# Patient Record
Sex: Female | Born: 1960 | Race: White | Hispanic: No | Marital: Single | Smoking: Former smoker
Health system: Southern US, Community
[De-identification: ages and names within clinical notes are randomized; demographics above are authoritative.]

## PROBLEM LIST (undated history)

## (undated) DIAGNOSIS — J45909 Unspecified asthma, uncomplicated: Secondary | ICD-10-CM

## (undated) DIAGNOSIS — K219 Gastro-esophageal reflux disease without esophagitis: Secondary | ICD-10-CM

## (undated) HISTORY — PX: ABDOMINAL HYSTERECTOMY: SHX81

## (undated) HISTORY — DX: Gastro-esophageal reflux disease without esophagitis: K21.9

## (undated) HISTORY — DX: Unspecified asthma, uncomplicated: J45.909

## (undated) HISTORY — PX: BREAST CYST ASPIRATION: SHX578

---

## 2015-03-27 LAB — IFOBT (OCCULT BLOOD): IFOBT: NEGATIVE

## 2017-03-25 DIAGNOSIS — Z139 Encounter for screening, unspecified: Secondary | ICD-10-CM

## 2017-03-25 LAB — POCT URINALYSIS DIPSTICK
Bilirubin, UA: NEGATIVE
GLUCOSE UA: NEGATIVE
KETONES UA: NEGATIVE
Leukocytes, UA: NEGATIVE
Nitrite, UA: NEGATIVE
Protein, UA: NEGATIVE
UROBILINOGEN UA: 0.2 U/dL
pH, UA: 7 (ref 5.0–8.0)

## 2017-03-25 LAB — GLUCOSE, POCT (MANUAL RESULT ENTRY): POC Glucose: 111 mg/dl — AB (ref 70–99)

## 2017-03-25 NOTE — Congregational Nurse Program (Signed)
Congregational Nurse Program Note  Date of Encounter: 03/25/2017  Past Medical History: No past medical history on file.  Encounter Details:     CNP Questionnaire - 03/25/17 1312      Patient Demographics   Is this a new or existing patient? New   Patient is considered a/an Not Applicable   Race Caucasian/White     Patient Assistance   Location of Patient Assistance [facility]   Patient's financial/insurance status Low Income;Self-Pay (Uninsured)   Uninsured Patient ([place] Card/Care Connects) Yes   Interventions Assisted patient in making appt.;Counseled to make appt. with provider   Patient referred to apply for the following financial assistance Medicaid   Transportation assistance No   Assistance securing medications No   Educational Designer, television/film set health;Hypertension;Navigating the healthcare system;Chronic disease;Safety     Encounter Details   Primary purpose of visit Chronic Illness/Condition Visit;Education/Health Concerns;Navigating the Healthcare System   Was an Emergency Department visit averted? Not Applicable   Does patient have a medical provider? No   Patient referred to Area Agency;Clinic;Establish PCP   Was a mental health screening completed? (GAINS tool) No   Does patient have dental issues? No   Does patient have vision issues? No   Does your patient have an abnormal blood pressure today? No   Since previous encounter, have you referred patient for abnormal blood pressure that resulted in a new diagnosis or medication change? No   Does your patient have an abnormal blood glucose today? No   Since previous encounter, have you referred patient for abnormal blood glucose that resulted in a new diagnosis or medication change? No   Was there a life-saving intervention made? No     New client to [employer]. She has recently moved from [place] and now lives in the [place] area. Her son lives with her. She is here today for help navigating  into a primary care provider.   Past medical History(client reported) Anemia Anxiety Arthritis Asthma Depression GERD Heart Murmur Cardiac arrhythmia (unknown type) Migraines Hiatal Hernia  Surgical History: Total Hysterectomy Multiple cysts removed, leg, arm, right breast,  Appendectomy  Vaginal repair after mesh erosion,  Rectal surgery after mesh erosion Thyroidectomy  Current Medications: L-Lysine L-Arginine Chromium Picolinate Saw Palmetto Hair skin and nails vitamin Tart Cherry extract Acetyl L-Carnitine  Estrace vaginally CBD oil Pro-Air inhaler Allegra D Probiotics  Alert and oriented to person and place, appropriately answers questions. Very talkative and appears nervous. States she wants to find a medical provider. She currently has no insurance, unemployed. She has a son that lives with her that just became employed and helps some. She is divorced. She states she has been involved in a lawsuit due to vaginal mesh used during her surgery is eroding and causing issues. She states this lawsuit has been ongoing now for 6 years.  Her chief complaint today is burning and itching vaginally and a sore throat. She attributes this to a one time sexual encounter "about 2 weeks ago" of unprotected sex. She went to a walk in clinic and was prescribed Diflucan and will be taking her second dose today. She has also been using monistat cream externally for discomfort and itching. Informed client that we can not perform pelvic exams, but could do a dipstick urine to make sure she does not have a urinary tract infection.  Client also inquiring about STD testing. RN discussed that the Health department does free STD screenings and we will assist in setting her up  an appointment.  She denies chest pains , but states she has been told she has a heart arrhythmia but she is not sure what type and that she has been diagnosed with a murmur. She has had an Echo in the past but unknown date.  She denies shortness of breath today, but reports history of asthma and has been on pro-air inhaler in the past. She denies thoughts of suicide but does want to meet with our social worker for assessment and possible options for counseling. Vital signs: 128/81 blood pressure sitting, pulse 85 regular, temp 98.1 orally and respiration non-labored at 12.  POCT labs Glucose non fasting 111 Urine dipstick Glucose Negative Bilirubin Negative Ketones Negative Protein Negative Urobilinogen 0.2 E.U Nitrites Negative Specific Gravity 1.030 Blood Trace/intact Leukocyte Negative  Discussed with client options for medical care and she [facility]. Referral made and appointment secured for 04/01/17 at 0930 am. Contact information given to client.  Ref[facility] made [facility] and appointment secured for 03/27/17 at 10:00 am and  contact information given to client.  Referral to [employer] Stafford MSW for needs assessment and mental health screening appointment secured [facility] at 1 pm.  Client agreeable with above plans and appointments. Information and all contact information regarding appointments given to client upon leaving.  Will follow up as needed.

## 2017-04-01 ENCOUNTER — Ambulatory Visit: Payer: Self-pay | Admitting: Physician Assistant

## 2017-04-06 ENCOUNTER — Encounter: Payer: Self-pay | Admitting: Physician Assistant

## 2017-04-06 ENCOUNTER — Telehealth: Payer: Self-pay

## 2017-04-06 NOTE — Telephone Encounter (Signed)
Pt was seen here at the [facility] on August 15th. Pt was wanting to get connected with a PCP after moving from [place]. She stated she had no insurance and was unemployed.   Past medical History(client reported) Anemia Anxiety Arthritis Asthma Depression GERD Heart Murmur Cardiac arrhythmia (unknown type) Migraines Hiatal Hernia  Surgical History: Total Hysterectomy Multiple cysts removed, leg, arm, right breast,  Appendectomy  Vaginal repair after mesh erosion,  Rectal surgery after mesh erosion Thyroidectomy  Current Medications: L-Lysine L-Arginine Chromium Picolinate Saw Palmetto Hair skin and nails vitamin Tart Cherry extract Acetyl L-Carnitine  Estrace vaginally CBD oil Pro-Air inhaler Allegra D Probiotics   [facility]17 at 9:30 am. Also referral made to the [facility] for STD screening for 03/27/17 at 10 am.    Follow up call was made today 04/06/2017 to ask about he PCP appointment with the [facility]. Pt states she completely forgot. Contact information for the [facility] was given. Pt states she was going to contact them. Reminded her of the rules for missing appointments and calling ahead of time if not able to go to her appointment.    Kenyanna Grzesiak R. Kiam Bransfield LPN [phone]

## 2017-04-07 NOTE — Congregational Nurse Program (Unsigned)
Congregational Nurse Program Note  Date of Encounter: 04/07/2017  Past Medical History: No past medical history on file.  Encounter Details:     CNP Questionnaire - 03/26/17 1539      Patient Demographics   Is this a new or existing patient? New   Patient is considered a/an Not Applicable   Race Caucasian/White     Patient Assistance   Location of Patient Assistance [facility]   Patient's financial/insurance status Low Income;Self-Pay (Uninsured)   Uninsured Patient ([place] Card/Care Connects) Yes   Interventions Not Applicable   Patient referred to apply for the following financial assistance Medicaid   Food insecurities addressed Not Applicable   Transportation assistance No   Assistance securing medications No   Educational health offerings Behavioral health;Navigating the healthcare system     Encounter Details   Primary purpose of visit Chronic Illness/Condition Visit;Education/Health Concerns;Navigating the Healthcare System   Was an Emergency Department visit averted? Not Applicable   Does patient have a medical provider? No   Patient referred to Not Applicable   Was a mental health screening completed? (GAINS tool) No   Does patient have dental issues? No   Does patient have vision issues? No   Does your patient have an abnormal blood pressure today? No   Since previous encounter, have you referred patient for abnormal blood pressure that resulted in a new diagnosis or medication change? No   Does your patient have an abnormal blood glucose today? No   Since previous encounter, have you referred patient for abnormal blood glucose that resulted in a new diagnosis or medication change? No   Was there a life-saving intervention made? No    Client was referred to Child psychotherapist by Congregational Nurse, Norval Gable, RN for a mental health assessment after client had reported dealing with significant grief.   Client was alert, answered questions appropriately  and was oriented to time and place. Client was talkative throughout the session. Client denied both suicidal and homicidal thoughts and had no previous attempts to hurt herself, as well as no previous attempts to hurt other people. Client denied auditory and visual hallucinations. Client also denied substance use.   Client stated that she felt she had dealt with her grief in a positive way and she is extremely close to her children. Client stated she would like to have someone to talk with about her intimate partner relationships if possible. Client has had physical issues related to sexual performance due to a vaginal mesh.  Social worker told client she will contact see if she can find resources in the area and she will reach out to the client.   Irwin Brakeman, MSW, [phone]

## 2017-04-09 ENCOUNTER — Telehealth: Payer: Self-pay

## 2017-04-14 NOTE — Telephone Encounter (Signed)
Social worker called client to inform her she was unable to locate resource in the area that met her needs. Social worker encouraged client to contact the [facility] with any needs in the future. [address] [place], [phone]

## 2017-08-20 ENCOUNTER — Other Ambulatory Visit (HOSPITAL_COMMUNITY): Payer: Self-pay | Admitting: *Deleted

## 2017-08-20 DIAGNOSIS — IMO0002 Reserved for concepts with insufficient information to code with codable children: Secondary | ICD-10-CM

## 2017-08-20 DIAGNOSIS — R229 Localized swelling, mass and lump, unspecified: Principal | ICD-10-CM

## 2017-09-01 ENCOUNTER — Encounter (HOSPITAL_COMMUNITY): Payer: Self-pay

## 2017-09-15 ENCOUNTER — Ambulatory Visit (HOSPITAL_COMMUNITY)
Admission: RE | Admit: 2017-09-15 | Discharge: 2017-09-15 | Disposition: A | Discharge status: Home / Self Care | Payer: PRIVATE HEALTH INSURANCE | Source: Ambulatory Visit | Attending: *Deleted | Admitting: *Deleted

## 2017-09-15 ENCOUNTER — Encounter (HOSPITAL_COMMUNITY): Payer: Self-pay | Admitting: Radiology

## 2017-09-15 DIAGNOSIS — R229 Localized swelling, mass and lump, unspecified: Principal | ICD-10-CM

## 2017-09-15 DIAGNOSIS — N6310 Unspecified lump in the right breast, unspecified quadrant: Secondary | ICD-10-CM | POA: Insufficient documentation

## 2017-09-15 DIAGNOSIS — IMO0002 Reserved for concepts with insufficient information to code with codable children: Secondary | ICD-10-CM

## 2018-02-18 ENCOUNTER — Other Ambulatory Visit (HOSPITAL_COMMUNITY): Payer: Self-pay | Admitting: *Deleted

## 2018-02-18 DIAGNOSIS — IMO0002 Reserved for concepts with insufficient information to code with codable children: Secondary | ICD-10-CM

## 2018-02-18 DIAGNOSIS — R229 Localized swelling, mass and lump, unspecified: Principal | ICD-10-CM

## 2018-03-23 ENCOUNTER — Ambulatory Visit (HOSPITAL_COMMUNITY)
Admission: RE | Admit: 2018-03-23 | Discharge: 2018-03-23 | Disposition: A | Discharge status: Home / Self Care | Payer: PRIVATE HEALTH INSURANCE | Source: Ambulatory Visit | Attending: *Deleted | Admitting: *Deleted

## 2018-03-23 ENCOUNTER — Encounter (HOSPITAL_COMMUNITY): Payer: PRIVATE HEALTH INSURANCE

## 2018-03-23 ENCOUNTER — Encounter (HOSPITAL_COMMUNITY): Payer: Self-pay

## 2018-03-23 DIAGNOSIS — IMO0002 Reserved for concepts with insufficient information to code with codable children: Secondary | ICD-10-CM

## 2018-03-23 DIAGNOSIS — N6312 Unspecified lump in the right breast, upper inner quadrant: Secondary | ICD-10-CM | POA: Insufficient documentation

## 2018-03-23 DIAGNOSIS — R229 Localized swelling, mass and lump, unspecified: Secondary | ICD-10-CM | POA: Diagnosis present

## 2018-10-07 ENCOUNTER — Other Ambulatory Visit (HOSPITAL_COMMUNITY): Payer: Self-pay | Admitting: *Deleted

## 2018-10-07 DIAGNOSIS — R229 Localized swelling, mass and lump, unspecified: Principal | ICD-10-CM

## 2018-10-07 DIAGNOSIS — IMO0002 Reserved for concepts with insufficient information to code with codable children: Secondary | ICD-10-CM

## 2018-10-11 ENCOUNTER — Other Ambulatory Visit (HOSPITAL_COMMUNITY): Payer: Self-pay | Admitting: *Deleted

## 2018-10-11 DIAGNOSIS — R229 Localized swelling, mass and lump, unspecified: Principal | ICD-10-CM

## 2018-10-11 DIAGNOSIS — IMO0002 Reserved for concepts with insufficient information to code with codable children: Secondary | ICD-10-CM

## 2018-10-26 ENCOUNTER — Ambulatory Visit (HOSPITAL_COMMUNITY): Payer: PRIVATE HEALTH INSURANCE

## 2018-10-26 ENCOUNTER — Encounter (HOSPITAL_COMMUNITY): Payer: PRIVATE HEALTH INSURANCE

## 2018-11-23 ENCOUNTER — Other Ambulatory Visit (HOSPITAL_COMMUNITY): Payer: PRIVATE HEALTH INSURANCE

## 2018-11-23 ENCOUNTER — Ambulatory Visit (HOSPITAL_COMMUNITY): Payer: PRIVATE HEALTH INSURANCE

## 2018-11-30 ENCOUNTER — Ambulatory Visit (HOSPITAL_COMMUNITY): Payer: PRIVATE HEALTH INSURANCE

## 2018-12-21 ENCOUNTER — Ambulatory Visit (HOSPITAL_COMMUNITY)
Admission: RE | Admit: 2018-12-21 | Discharge: 2018-12-21 | Disposition: A | Discharge status: Home / Self Care | Payer: PRIVATE HEALTH INSURANCE | Source: Ambulatory Visit | Attending: *Deleted | Admitting: *Deleted

## 2018-12-21 ENCOUNTER — Ambulatory Visit (HOSPITAL_COMMUNITY): Payer: PRIVATE HEALTH INSURANCE

## 2018-12-21 ENCOUNTER — Other Ambulatory Visit: Payer: Self-pay

## 2018-12-21 DIAGNOSIS — R229 Localized swelling, mass and lump, unspecified: Secondary | ICD-10-CM | POA: Diagnosis present

## 2018-12-21 DIAGNOSIS — IMO0002 Reserved for concepts with insufficient information to code with codable children: Secondary | ICD-10-CM

## 2020-01-24 ENCOUNTER — Other Ambulatory Visit (HOSPITAL_COMMUNITY): Payer: Self-pay | Admitting: *Deleted

## 2020-01-24 DIAGNOSIS — N6310 Unspecified lump in the right breast, unspecified quadrant: Secondary | ICD-10-CM

## 2020-01-31 ENCOUNTER — Other Ambulatory Visit (HOSPITAL_COMMUNITY): Payer: Self-pay | Admitting: *Deleted

## 2020-01-31 DIAGNOSIS — N63 Unspecified lump in unspecified breast: Secondary | ICD-10-CM

## 2020-02-01 ENCOUNTER — Other Ambulatory Visit (HOSPITAL_COMMUNITY): Payer: Self-pay | Admitting: *Deleted

## 2020-02-01 DIAGNOSIS — N63 Unspecified lump in unspecified breast: Secondary | ICD-10-CM

## 2020-02-14 ENCOUNTER — Ambulatory Visit (HOSPITAL_COMMUNITY): Payer: Self-pay

## 2020-02-14 ENCOUNTER — Other Ambulatory Visit (HOSPITAL_COMMUNITY): Payer: Self-pay

## 2020-02-14 ENCOUNTER — Encounter (HOSPITAL_COMMUNITY): Payer: Self-pay

## 2020-03-27 ENCOUNTER — Other Ambulatory Visit (HOSPITAL_COMMUNITY): Payer: Self-pay

## 2020-03-27 ENCOUNTER — Encounter (HOSPITAL_COMMUNITY): Payer: Self-pay

## 2020-03-29 ENCOUNTER — Encounter (HOSPITAL_COMMUNITY): Payer: Self-pay

## 2020-03-29 ENCOUNTER — Ambulatory Visit (HOSPITAL_COMMUNITY): Payer: Self-pay

## 2020-05-15 ENCOUNTER — Ambulatory Visit (HOSPITAL_COMMUNITY): Payer: Self-pay

## 2020-05-15 ENCOUNTER — Encounter (HOSPITAL_COMMUNITY): Payer: Self-pay

## 2020-06-05 ENCOUNTER — Other Ambulatory Visit (HOSPITAL_COMMUNITY): Payer: Self-pay

## 2020-06-05 ENCOUNTER — Encounter (HOSPITAL_COMMUNITY): Payer: Self-pay

## 2020-06-12 ENCOUNTER — Encounter (HOSPITAL_COMMUNITY): Payer: Self-pay

## 2020-06-12 ENCOUNTER — Ambulatory Visit (HOSPITAL_COMMUNITY): Payer: Self-pay

## 2020-06-12 ENCOUNTER — Ambulatory Visit (HOSPITAL_COMMUNITY): Admission: RE | Admit: 2020-06-12 | Payer: Self-pay | Source: Ambulatory Visit

## 2020-06-15 ENCOUNTER — Other Ambulatory Visit: Payer: Self-pay

## 2020-06-15 ENCOUNTER — Ambulatory Visit (HOSPITAL_COMMUNITY)
Admission: RE | Admit: 2020-06-15 | Discharge: 2020-06-15 | Disposition: A | Discharge status: Home / Self Care | Payer: PRIVATE HEALTH INSURANCE | Source: Ambulatory Visit | Attending: *Deleted | Admitting: *Deleted

## 2020-06-15 DIAGNOSIS — N63 Unspecified lump in unspecified breast: Secondary | ICD-10-CM | POA: Diagnosis not present

## 2020-06-15 DIAGNOSIS — N6310 Unspecified lump in the right breast, unspecified quadrant: Secondary | ICD-10-CM

## 2021-02-06 ENCOUNTER — Ambulatory Visit (INDEPENDENT_AMBULATORY_CARE_PROVIDER_SITE_OTHER): Payer: 59 | Admitting: Nurse Practitioner

## 2021-02-06 ENCOUNTER — Other Ambulatory Visit (HOSPITAL_COMMUNITY)
Admission: RE | Admit: 2021-02-06 | Discharge: 2021-02-06 | Disposition: A | Discharge status: Home / Self Care | Payer: 59 | Source: Ambulatory Visit | Attending: Nurse Practitioner | Admitting: Nurse Practitioner

## 2021-02-06 ENCOUNTER — Other Ambulatory Visit: Payer: Self-pay

## 2021-02-06 ENCOUNTER — Encounter: Payer: Self-pay | Admitting: Nurse Practitioner

## 2021-02-06 VITALS — BP 123/80 | HR 85 | Temp 98.0°F | Ht 62.0 in | Wt 146.0 lb

## 2021-02-06 DIAGNOSIS — N939 Abnormal uterine and vaginal bleeding, unspecified: Secondary | ICD-10-CM | POA: Diagnosis not present

## 2021-02-06 DIAGNOSIS — Z7689 Persons encountering health services in other specified circumstances: Secondary | ICD-10-CM | POA: Insufficient documentation

## 2021-02-06 DIAGNOSIS — L659 Nonscarring hair loss, unspecified: Secondary | ICD-10-CM

## 2021-02-06 DIAGNOSIS — R5383 Other fatigue: Secondary | ICD-10-CM | POA: Diagnosis not present

## 2021-02-06 DIAGNOSIS — N952 Postmenopausal atrophic vaginitis: Secondary | ICD-10-CM

## 2021-02-06 DIAGNOSIS — Z202 Contact with and (suspected) exposure to infections with a predominantly sexual mode of transmission: Secondary | ICD-10-CM

## 2021-02-06 MED ORDER — PREMARIN 0.625 MG/GM VA CREA: 1.0000 | TOPICAL_CREAM | Freq: Every day | VAGINAL | 12 refills | Status: DC

## 2021-02-06 NOTE — Assessment & Plan Note (Signed)
Patient is reporting possible exposure to sexually transmitted disease.  Patient reports partner use condoms but after oral sex he did kiss her and few days later she developed sore throat and became concerned she may have contracted an STD.  Completed urine chlamydia/gonorrhea, trichomonas with results pending. Education provided to patient printed handouts given.  Follow-up with worsening or unresolved symptoms we will treat patient pending lab results.

## 2021-02-06 NOTE — Progress Notes (Signed)
New Patient Note  RE: Colleen Singleton MRN: [id number] DOB: 1961/02/28 Date of Office Visit: 02/06/2021  Chief Complaint: Establish Care, Vaginal Bleeding, Hair/Scalp Problem, and Fatigue  History of Present Illness:  Vaginitis: Patient complains of an abnormal vaginal discharge for a few days. Vaginal symptoms include abnormal bleeding: post-menopausal and pain.Vulvar symptoms include pain and post coital bleeding.STI Risk: STD exposureDischarge described as: scant and white.Other associated symptoms: pain.Menstrual pattern: She had been bleeding after intercourse. Contraception: none   Fatigue: Patient complains of fatigue. Symptoms began about a month ago. Sentinal symptom the patient feels fatigue began with: none. Symptoms of her fatigue have been lack of interest in usual activities. Patient describes the following psychologic symptoms: none.  Patient denies fever, symptoms of arthritis, exercise intolerance, and unusual rashes. Symptoms have gradually worsened. Severity has been moderate. Previous visits for this problem: none.     Hair loss Patient is reporting symptoms of hair loss in the past few weeks to months.  She reports she used to have a full head of hair recently that he has been falling out.  She has not changed shampoos conditioners or head to assist.  Patient is concerned that menopause is causing her to head.Marland Kitchen    Sexually Transmitted Disease Check: Patient presents for sexually transmitted disease check. Sexual history reviewed with the patient. STD exposure: current sexual partner thought to have history of "can not remember".  Previous history of STD:  not on record, patient can not remeber. Current symptoms include vaginal irritation: moderate.  Contraception: post menopausal status.      Assessment and Plan: Colleen Singleton is a 60 y.o. female with: Atrophic vaginitis Patient is reporting symptoms of vaginal bleeding.  Recently was sexually active after a year.  She  describes the pain as moderate.  Completed cervical exam, no bruising, redness or active bleeding observed.  Moderate amount of vaginal discharge white in color.  Education provided to patient printed handouts given.  Completed labs results pending.  Started patient on Premarin vaginal cream.    Encounter for assessment of STD exposure Patient is reporting possible exposure to sexually transmitted disease.  Patient reports partner use condoms but after oral sex he did kiss her and few days later she developed sore throat and became concerned she may have contracted an STD.  Completed urine chlamydia/gonorrhea, trichomonas with results pending. Education provided to patient printed handouts given.  Follow-up with worsening or unresolved symptoms we will treat patient pending lab results.  Vaginal bleeding Patient is postmenopausal with a history of total hysterectomy.  Bleeding noticed after sexual activity.  No current active bleeding on assessment today.  Mild pain and irritation present.  Completed cervical exam.  Symptoms represent atrophic vaginitis.  Education provided to patient with printed handouts.  Will wait for labs to come back and treat patient appropriately.  Advised to use Tylenol or ibuprofen for pain.  K-Y jelly to help moisturize vagina.  Premarin cream sent to pharmacy.  Follow-up with worsening unresolved symptoms.  Other fatigue Patient is experiencing fatigue in the last few months.  Patient is postmenopausal and cannot really explain reasons for fatigue.  Completed B12, CBC, TSH, CMP and lipid panel. Lab results pending.  Education provided to patient printed handouts given. Follow-up with worsening or unresolved symptoms.  Hair loss This is new in the last few months.  Completed labs results pending.  On assessment patient's hair is thinner but no obvious patches on patient's scalp.  Advised patient on daily multivitamin, increase hydration,  balanced diet, pending lab  results we will treat for hormonal changes. Patient verbalized understanding  Return if symptoms worsen or fail to improve.   Diagnostics:   Past Medical History: Patient Active Problem List   Diagnosis Date Noted   Encounter for assessment of STD exposure 02/06/2021   Vaginal bleeding 02/06/2021   Other fatigue 02/06/2021   Hair loss 02/06/2021   Atrophic vaginitis 02/06/2021   Past Medical History:  Diagnosis Date   Asthma    GERD (gastroesophageal reflux disease)    Past Surgical History: Past Surgical History:  Procedure Laterality Date   ABDOMINAL HYSTERECTOMY     BREAST CYST ASPIRATION Right    Medication List:  Current Outpatient Medications  Medication Sig Dispense Refill   albuterol (VENTOLIN HFA) 108 (90 Base) MCG/ACT inhaler Inhale into the lungs every 6 (six) hours as needed for wheezing or shortness of breath.     conjugated estrogens (PREMARIN) vaginal cream Place 1 Applicatorful vaginally daily. 42.5 g 12   No current facility-administered medications for this visit.   Allergies: Allergies  Allergen Reactions   Ketek [Telithromycin] Other (See Comments)    vertigo   Social History: Social History   Socioeconomic History   Marital status: Single    Spouse name: Not on file   Number of children: Not on file   Years of education: Not on file   Highest education level: Not on file  Occupational History   Not on file  Tobacco Use   Smoking status: Former    Pack years: 0.00    Types: Cigarettes    Quit date: 2000    Years since quitting: 22.5   Smokeless tobacco: Not on file  Vaping Use   Vaping Use: Never used  Substance and Sexual Activity   Alcohol use: Not on file   Drug use: Not on file   Sexual activity: Yes    Birth control/protection: Condom  Other Topics Concern   Not on file  Social History Narrative   Not on file   Social Determinants of Health   Financial Resource Strain: Not on file  Food Insecurity: Not on file   Transportation Needs: Not on file  Physical Activity: Not on file  Stress: Not on file  Social Connections: Not on file       Family History: Family History  Problem Relation Age of Onset   Asthma Mother    COPD Mother    Diabetes Mother    Heart disease Mother    Hypertension Mother    Hypertension Father    Heart disease Father          Review of Systems  HENT: Negative.    Respiratory: Negative.    Gastrointestinal: Negative.   Genitourinary:  Positive for vaginal bleeding, vaginal discharge and vaginal pain. Negative for flank pain, pelvic pain and urgency.  Musculoskeletal: Negative.   Skin:  Negative for rash.  All other systems reviewed and are negative. Objective: BP 123/80   Pulse 85   Temp 98 F (36.7 C) (Temporal)   Ht 5\' 2"  (1.575 m)   Wt 146 lb (66.2 kg)   SpO2 98%   BMI 26.70 kg/m  Body mass index is 26.7 kg/m. Physical Exam Vitals and nursing note reviewed. Exam conducted with a chaperone present.  Constitutional:      Appearance: Normal appearance.  HENT:     Head: Normocephalic.     Nose: No congestion.     Mouth/Throat:  Mouth: Mucous membranes are moist.     Pharynx: Oropharynx is clear.  Eyes:     Conjunctiva/sclera: Conjunctivae normal.  Cardiovascular:     Rate and Rhythm: Normal rate and regular rhythm.     Pulses: Normal pulses.     Heart sounds: Normal heart sounds.  Pulmonary:     Effort: Pulmonary effort is normal.     Breath sounds: Normal breath sounds.  Genitourinary:    General: Normal vulva.     Vagina: Vaginal discharge present.  Skin:    Findings: No rash.  Neurological:     Mental Status: She is alert and oriented to person, place, and time.  Psychiatric:        Behavior: Behavior normal.   The plan was reviewed with the patient/family, and all questions/concerned were addressed.  It was my pleasure to see Colleen Singleton today and participate in her care. Please feel free to contact me with any questions or  concerns.  Sincerely,  Lynnell Chad NP [facility]

## 2021-02-06 NOTE — Patient Instructions (Signed)
Alopecia Areata, Adult  Alopecia areata is a condition that causes hair loss. A person with this condition may lose hair on the scalp in patches. In some cases, a person may lose all the hair on the scalp or all the hair from the face and body. Havingthis condition can be emotionally difficult, but it is not dangerous. Alopecia areata is an autoimmune disease. This means that your body's defense system (immune system) mistakes normal parts of the body for germs or other things that can make you sick. When you have alopecia areata, the immune system attacks the hairfollicles. What are the causes? The cause of this condition is not known. What increases the risk? You are more likely to develop this condition if you have: A family history of alopecia. A family history of another autoimmune disease, including type 1 diabetes and thyroid autoimmune disease. Eczema, asthma, and allergies. Down syndrome. What are the signs or symptoms? The main symptom of this condition is round spots of patchy hair loss on the scalp. The spots may be mildly itchy. Other symptoms include: Short dark hairs in the bald patches that are wider at the top (exclamation point hairs). Dents, white spots, or lines in the fingernails or toenails. Balding and body hair loss. This is rare. Alopecia areata usually develops in childhood, but it can develop at any age. For some people, their hair grows back on its own and hair loss does not happen again. For others, their hair may fall out and grow back in cycles. The hairloss may last many years. How is this diagnosed? This condition is diagnosed based on your symptoms and family history. Your health care provider will also check your scalp skin, teeth, and nails. Your health care provider may refer you to a specialist in hair and skin disorders (dermatologist). You may also have tests, including: A hair pull test. Blood tests or other screening tests to check for autoimmune  diseases, such as thyroid disease or diabetes. Skin biopsy to confirm the diagnosis. A procedure to examine the skin with a lighted magnifying instrument (dermoscopy). How is this treated? There is no cure for alopecia areata. The goals of treatment are to promote the regrowth of hair and prevent the immune system from overreacting. No single treatment is right for all people with alopecia areata. It depends on the typeof hair loss you have and how severe it is. Work with your health care provider to find the best treatment for you. Treatment may include: Regular checkups to make sure the condition is not getting worse . This is called watchful waiting. Using steroid creams or pills for 6-8 weeks to stop the immune reaction and help hair to regrow more quickly. Using other medicines on your skin (topical medicines) to change the immune system response and support the hair growth cycle. Steroid injections. Therapy and counseling with a support group or therapist if you are having trouble coping with hair loss. Follow these instructions at home: Medicines Apply topical creams only as told by your health care provider. Take over-the-counter and prescription medicines only as told by your health care provider. General instructions Learn as much as you can about your condition. Consider getting a wig or products to make hair look fuller or to cover bald spots, if you feel uncomfortable with your appearance. Get therapy or counseling if you are having a hard time coping with hair loss. Ask your health care provider to recommend a counselor or support group. Keep all follow-up visits as  told by your health care provider. This is important. Where to find more information [employer] [place].org Contact a health care provider if: Your hair loss gets worse, even with treatment. You have new symptoms. You are struggling emotionally. Get help right away if: You have a sudden  worsening of the hair loss. Summary Alopecia areata is an autoimmune condition that makes your body's defense system (immune system) attack the hair follicles. This causes you to lose hair. Having this condition can be emotionally difficult, but it is not dangerous. Treatments may include regular checkups to make sure that the condition is not getting worse, medicines, and steroid injections. This information is not intended to replace advice given to you by your health care provider. Make sure you discuss any questions you have with your healthcare provider. Document Revised: 10/11/2019 Document Reviewed: 10/11/2019 Elsevier Patient Education  2022 [place]. Fatigue If you have fatigue, you feel tired all the time and have a lack of energy or a lack of motivation. Fatigue may make it difficult to start or complete tasks because of exhaustion. In general, occasional or mild fatigue is often a normal response to activity or life. However, long-lasting (chronic) or extreme fatigue may be a symptom of a medical condition. Follow these instructions at home: General instructions Watch your fatigue for any changes. Go to bed and get up at the same time every day. Avoid fatigue by pacing yourself during the day and getting enough sleep at night. Maintain a healthy weight. Medicines Take over-the-counter and prescription medicines only as told by your health care provider. Take a multivitamin, if told by your health care provider.  Do not use herbal or dietary supplements unless they are approved by your health care provider. Activity  Exercise regularly, as told by your health care provider. Use or practice techniques to help you relax, such as yoga, tai chi, meditation, or massage therapy.  Eating and drinking  Avoid heavy meals in the evening. Eat a well-balanced diet, which includes lean proteins, whole grains, plenty of fruits and vegetables, and low-fat dairy products. Avoid consuming  too much caffeine. Avoid the use of alcohol. Drink enough fluid to keep your urine pale yellow.  Lifestyle Change situations that cause you stress. Try to keep your work and personal schedule in balance. Do not use any products that contain nicotine or tobacco, such as cigarettes and e-cigarettes. If you need help quitting, ask your health care provider. Do not use drugs. Contact a health care provider if: Your fatigue does not get better. You have a fever. You suddenly lose or gain weight. You have headaches. You have trouble falling asleep or sleeping through the night. You feel angry, guilty, anxious, or sad. You are unable to have a bowel movement (constipation). Your skin is dry. You have swelling in your legs or another part of your body. Get help right away if: You feel confused. Your vision is blurry. You feel faint or you pass out. You have a severe headache. You have severe pain in your abdomen, your back, or the area between your waist and hips (pelvis). You have chest pain, shortness of breath, or an irregular or fast heartbeat. You are unable to urinate, or you urinate less than normal. You have abnormal bleeding, such as bleeding from the rectum, vagina, nose, lungs, or nipples. You vomit blood. You have thoughts about hurting yourself or others. If you ever feel like you may hurt yourself or others, or have thoughts about  taking your own life, get help right away. You can go to your nearest emergency department or call: Your local emergency services (911 in the [place] A suicide crisis helpline, such as the [place] at 5857133105. This is open 24 hours a day. Summary If you have fatigue, you feel tired all the time and have a lack of energy or a lack of motivation. Fatigue may make it difficult to start or complete tasks because of exhaustion. Long-lasting (chronic) or extreme fatigue may be a symptom of a medical condition. Exercise  regularly, as told by your health care provider. Change situations that cause you stress. Try to keep your work and personal schedule in balance. This information is not intended to replace advice given to you by your health care provider. Make sure you discuss any questions you have with your healthcare provider. Document Revised: 06/07/2020 Document Reviewed: 06/07/2020 Elsevier Patient Education  2022 [place]. Atrophic Vaginitis  Atrophic vaginitis is when the lining of the vagina becomes dry and thin. This is most common in women who have stopped having their periods (are in menopause). It usually starts when a woman is 69 to 59 years old. What are the causes? This condition is caused by a drop in a female hormone (estrogen). What increases the risk? You are more likely to develop this condition if: You take certain medicines. You have had your ovaries taken out. You are being treated for cancer. You have given birth or are breastfeeding. You are more than 60 years old. You smoke. What are the signs or symptoms? Pain during sex. A feeling of pressure during sex. Bleeding during sex. Burning or itching in the vagina. Burning pain when you pee (urinate). Fluid coming from your vagina. Some people do not have symptoms. How is this treated? Using a lubricant before sex. Using a moisturizer in the vagina. Using estrogen in the vagina. In some cases, you may not need treatment. Follow these instructions at home: Medicines Take all medicines only as told by your doctor. This includes medicines for dryness. Do not use herbal medicines unless your doctor says it is okay. General instructions Talk with your doctor about treatment. Do not douche. Do not use scented: Sprays. Tampons. Soaps. If sex hurts, try using lubricants right before you have sex. Contact a doctor if: You have fluid coming from the vagina that is not like normal. You have a bad smell coming from your  vagina. You have new symptoms. Your symptoms do not get better when treated. Your symptoms get worse. Summary This condition happens when the lining of the vagina becomes dry and thin. It is most common in women who no longer have periods. Treatment may include using medicines for dryness. Call a doctor if your symptoms do not get better. This information is not intended to replace advice given to you by your health care provider. Make sure you discuss any questions you have with your healthcare provider. Document Revised: 01/26/2020 Document Reviewed: 01/26/2020 Elsevier Patient Education  [place].

## 2021-02-06 NOTE — Assessment & Plan Note (Signed)
Patient is reporting symptoms of vaginal bleeding.  Recently was sexually active after a year.  She describes the pain as moderate.  Completed cervical exam, no bruising, redness or active bleeding observed.  Moderate amount of vaginal discharge white in color.  Education provided to patient printed handouts given.  Completed labs results pending.  Started patient on Premarin vaginal cream.

## 2021-02-06 NOTE — Assessment & Plan Note (Signed)
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  Bleeding noticed after sexual activity.  No current active bleeding on assessment today.  Mild pain and irritation present.  Completed cervical exam.  Symptoms represent atrophic vaginitis.  Education provided to patient with printed handouts.  Will wait for labs to come back and treat patient appropriately.  Advised to use Tylenol or ibuprofen for pain.  K-Y jelly to help moisturize vagina.  Premarin cream sent to pharmacy.  Follow-up with worsening unresolved symptoms.

## 2021-02-06 NOTE — Assessment & Plan Note (Signed)
This is new in the last few months.  Completed labs results pending.  On assessment patient's hair is thinner but no obvious patches on patient's scalp.  Advised patient on daily multivitamin, increase hydration, balanced diet, pending lab results we will treat for hormonal changes. Patient verbalized understanding

## 2021-02-06 NOTE — Assessment & Plan Note (Signed)
Patient is experiencing fatigue in the last few months.  Patient is postmenopausal and cannot really explain reasons for fatigue.  Completed B12, CBC, TSH, CMP and lipid panel. Lab results pending.  Education provided to patient printed handouts given. Follow-up with worsening or unresolved symptoms.

## 2021-02-07 ENCOUNTER — Telehealth: Payer: Self-pay | Admitting: Nurse Practitioner

## 2021-02-07 LAB — COMPREHENSIVE METABOLIC PANEL
ALT: 11 IU/L (ref 0–32)
AST: 15 IU/L (ref 0–40)
Albumin/Globulin Ratio: 1.8 (ref 1.2–2.2)
Albumin: 4.6 g/dL (ref 3.8–4.9)
Alkaline Phosphatase: 88 IU/L (ref 44–121)
BUN/Creatinine Ratio: 16 (ref 9–23)
BUN: 12 mg/dL (ref 6–24)
Bilirubin Total: 0.4 mg/dL (ref 0.0–1.2)
CO2: 26 mmol/L (ref 20–29)
Calcium: 9.5 mg/dL (ref 8.7–10.2)
Chloride: 102 mmol/L (ref 96–106)
Creatinine, Ser: 0.77 mg/dL (ref 0.57–1.00)
Globulin, Total: 2.5 g/dL (ref 1.5–4.5)
Glucose: 90 mg/dL (ref 65–99)
Potassium: 4.1 mmol/L (ref 3.5–5.2)
Sodium: 140 mmol/L (ref 134–144)
Total Protein: 7.1 g/dL (ref 6.0–8.5)
eGFR: 89 mL/min/{1.73_m2} (ref >59)

## 2021-02-07 LAB — CBC WITH DIFFERENTIAL/PLATELET
Basophils Absolute: 0 10*3/uL (ref 0.0–0.2)
Basos: 1 %
EOS (ABSOLUTE): 0 10*3/uL (ref 0.0–0.4)
Eos: 1 %
Hematocrit: 42.6 % (ref 34.0–46.6)
Hemoglobin: 13.6 g/dL (ref 11.1–15.9)
Immature Grans (Abs): 0 10*3/uL (ref 0.0–0.1)
Immature Granulocytes: 0 %
Lymphocytes Absolute: 2.6 10*3/uL (ref 0.7–3.1)
Lymphs: 45 %
MCH: 29.5 pg (ref 26.6–33.0)
MCHC: 31.9 g/dL (ref 31.5–35.7)
MCV: 92 fL (ref 79–97)
Monocytes Absolute: 0.4 10*3/uL (ref 0.1–0.9)
Monocytes: 7 %
Neutrophils Absolute: 2.6 10*3/uL (ref 1.4–7.0)
Neutrophils: 46 %
Platelets: 328 10*3/uL (ref 150–450)
RBC: 4.61 x10E6/uL (ref 3.77–5.28)
RDW: 12.7 % (ref 11.7–15.4)
WBC: 5.7 10*3/uL (ref 3.4–10.8)

## 2021-02-07 LAB — LIPID PANEL
Chol/HDL Ratio: 3.8 ratio (ref 0.0–4.4)
Cholesterol, Total: 248 mg/dL — ABNORMAL HIGH (ref 100–199)
HDL: 65 mg/dL (ref >39)
LDL Chol Calc (NIH): 164 mg/dL — ABNORMAL HIGH (ref 0–99)
Triglycerides: 106 mg/dL (ref 0–149)
VLDL Cholesterol Cal: 19 mg/dL (ref 5–40)

## 2021-02-07 LAB — TSH: TSH: 2.82 u[IU]/mL (ref 0.450–4.500)

## 2021-02-07 LAB — URINE CYTOLOGY ANCILLARY ONLY
Chlamydia: NEGATIVE
Comment: NEGATIVE
Comment: NEGATIVE
Comment: NORMAL
Neisseria Gonorrhea: NEGATIVE
Trichomonas: NEGATIVE

## 2021-02-07 LAB — HIV ANTIBODY (ROUTINE TESTING W REFLEX): HIV Screen 4th Generation wRfx: NONREACTIVE

## 2021-02-07 LAB — VITAMIN B12: Vitamin B-12: 345 pg/mL (ref 232–1245)

## 2021-02-08 ENCOUNTER — Other Ambulatory Visit: Payer: Self-pay | Admitting: Nurse Practitioner

## 2021-02-08 DIAGNOSIS — N952 Postmenopausal atrophic vaginitis: Secondary | ICD-10-CM

## 2021-02-08 MED ORDER — ESTROGENS CONJUGATED 0.3 MG PO TABS: 0.3000 mg | ORAL_TABLET | Freq: Every day | ORAL | 1 refills | Status: DC

## 2021-02-08 MED ORDER — ESTRADIOL 0.25 MG/0.25GM TD GEL: 1.0000 "application " | Freq: Once | TRANSDERMAL | 2 refills | Status: DC

## 2021-02-08 NOTE — Telephone Encounter (Signed)
Please send estradial, that is all insurance will cover, thank you

## 2021-02-08 NOTE — Telephone Encounter (Signed)
Pt called stating that her insurance is not covering the pill form either. Wants to know what her options are?  Please advise and call patient.

## 2021-02-12 ENCOUNTER — Telehealth: Payer: Self-pay

## 2021-02-12 ENCOUNTER — Other Ambulatory Visit: Payer: Self-pay

## 2021-02-12 DIAGNOSIS — N952 Postmenopausal atrophic vaginitis: Secondary | ICD-10-CM

## 2021-02-12 MED ORDER — ESTRADIOL 0.25 MG/0.25GM TD GEL
1.0000 | Freq: Once | TRANSDERMAL | 2 refills | Status: DC
Start: 2021-02-12 — End: 2021-02-18

## 2021-02-12 NOTE — Telephone Encounter (Signed)
Je,  [facility] is calling to clarify Estradiol gel. Per pharmacist she has not heard of this medication.  Per last note by Je, vaginal cream is not covered by insurance and she sent in tablet form. [facility] made aware.

## 2021-02-14 NOTE — Telephone Encounter (Signed)
Pt called in--she needs new rx for Estradiol 0.25 MG/0.25GM GEL--CHANGE TO 0.50GM [facility] [place] on [address] phone [phone]

## 2021-02-15 ENCOUNTER — Ambulatory Visit: Payer: 59 | Admitting: Family Medicine

## 2021-02-18 ENCOUNTER — Other Ambulatory Visit: Payer: Self-pay

## 2021-02-18 DIAGNOSIS — N952 Postmenopausal atrophic vaginitis: Secondary | ICD-10-CM

## 2021-02-18 MED ORDER — ESTRADIOL 0.25 MG/0.25GM TD GEL
1.0000 | Freq: Once | TRANSDERMAL | 2 refills | Status: DC
Start: 2021-02-18 — End: 2023-03-03

## 2021-02-18 NOTE — Telephone Encounter (Signed)
Pt never received the medication. [facility] [place] [facility] of [place]. Rx resent to [place]. Pt will check with them later this afternoon.

## 2022-02-04 ENCOUNTER — Other Ambulatory Visit (HOSPITAL_COMMUNITY): Payer: Self-pay | Admitting: Obstetrics and Gynecology

## 2022-02-04 DIAGNOSIS — Z1231 Encounter for screening mammogram for malignant neoplasm of breast: Secondary | ICD-10-CM

## 2022-02-14 ENCOUNTER — Ambulatory Visit (HOSPITAL_COMMUNITY)
Admission: RE | Admit: 2022-02-14 | Discharge: 2022-02-14 | Disposition: A | Discharge status: Home / Self Care | Payer: PRIVATE HEALTH INSURANCE | Source: Ambulatory Visit | Attending: Obstetrics and Gynecology | Admitting: Obstetrics and Gynecology

## 2022-02-14 ENCOUNTER — Encounter (HOSPITAL_COMMUNITY): Payer: Self-pay

## 2022-02-14 ENCOUNTER — Inpatient Hospital Stay (HOSPITAL_COMMUNITY): Payer: PRIVATE HEALTH INSURANCE | Attending: Obstetrics and Gynecology | Admitting: *Deleted

## 2022-02-14 VITALS — Wt 158.4 lb

## 2022-02-14 DIAGNOSIS — Z1239 Encounter for other screening for malignant neoplasm of breast: Secondary | ICD-10-CM

## 2022-02-14 DIAGNOSIS — Z1231 Encounter for screening mammogram for malignant neoplasm of breast: Secondary | ICD-10-CM | POA: Insufficient documentation

## 2022-02-14 NOTE — Progress Notes (Signed)
Colleen Singleton is a 61 y.o. female who presents to [facility] clinic today with complaints of bilateral milky nipple discharge when expressed x 44 years. Patient has history of multiple mammograms over the years..   Pap Smear: Pap smear not completed today. Last Pap smear was 03/22/2015 at [facility] clinic and was normal per patient. Per patient has history of an abnormal Pap smear around 20 years ago that a repeat Pap smear was completed for follow up that was normal. Patient stated that all Pap smears have been normal since and that she has had at least three normal Pap smears. Patient has a history of a hysterectomy in July 2014 due to prolapsed uterus. Patient doesn't need any further Pap smears due to her history of a hysterectomy for benign reasons per BCCCP and ASCCP guidelines. Last Pap smear result is not available in Epic.   Physical exam: Breasts Breasts symmetrical. No skin abnormalities bilateral breasts. No nipple retraction bilateral breasts. No nipple discharge bilateral breasts. Unable to express any nipple discharge on exam. No lymphadenopathy. No lumps palpated bilateral breasts. No complaints of pain or tenderness on exam.     MS DIGITAL DIAG TOMO BILAT  Result Date: 06/15/2020 CLINICAL DATA:  Patient for 2 year follow-up of probably benign right breast mass. EXAM: DIGITAL DIAGNOSTIC BILATERAL MAMMOGRAM WITH CAD AND TOMO ULTRASOUND RIGHT BREAST COMPARISON:  Previous exam(s). ACR Breast Density Category c: The breast tissue is heterogeneously dense, which may obscure small masses. FINDINGS: Grossly unchanged oval circumscribed mass within the inner right breast. No new masses, calcifications or nonsurgical distortion identified within either breast. Mammographic images were processed with CAD. Targeted ultrasound is performed, showing a stable 7 x 7 x 3 mm oval circumscribed hypoechoic right breast position 3 cm compatible with benign process given stability over time.  IMPRESSION: No mammographic evidence for malignancy. RECOMMENDATION: Screening mammogram in one year.(Code:[id number]) I have discussed the findings and recommendations with the patient. If applicable, a reminder letter will be sent to the patient regarding the next appointment. BI-RADS CATEGORY  1: Negative. Electronically Signed   By: Annia Belt M.D.   On: 06/15/2020 09:16   MM DIAG BREAST TOMO BILATERAL  Result Date: 12/21/2018 CLINICAL DATA:  61 year old female for 1 year follow-up of RIGHT breast mass and for annual bilateral mammograms. EXAM: DIGITAL DIAGNOSTIC BILATERAL MAMMOGRAM WITH CAD AND TOMO ULTRASOUND RIGHT BREAST COMPARISON:  Previous exam(s). ACR Breast Density Category b: There are scattered areas of fibroglandular density. FINDINGS: 2D/3D full field views of both breast demonstrate a stable circumscribed oval mass within the UPPER INNER RIGHT breast. No new mass, distortion or worrisome calcifications identified within either breast. Mammographic images were processed with CAD. Targeted ultrasound is performed, showing a stable 0.6 x 0.4 x 0.6 cm circumscribed oval hypoechoic parallel mass at the 1 o'clock position of the RIGHT breast 3 cm from the nipple. IMPRESSION: 1. Stable likely benign mass in the UPPER INNER RIGHT breast. One year follow-up recommended to ensure 2 year stability. 2. No other mammographic abnormalities. RECOMMENDATION: Bilateral diagnostic mammogram with RIGHT breast ultrasound in 1 year. I have discussed the findings and recommendations with the patient. Results were also provided in writing at the conclusion of the visit. If applicable, a reminder letter will be sent to the patient regarding the next appointment. BI-RADS CATEGORY  3: Probably benign. Electronically Signed   By: Harmon Pier M.D.   On: 12/21/2018 14:30   MM DIAG BREAST TOMO BILATERAL  Result Date: 09/15/2017 CLINICAL  DATA:  61 year old patient palpates a lump in the 1 o'clock region of the right breast.  She states that she has a history of breast cysts in the past. EXAM: 2D DIGITAL DIAGNOSTIC BILATERAL MAMMOGRAM WITH CAD AND ADJUNCT TOMO ULTRASOUND RIGHT BREAST COMPARISON:  Previous exam(s). ACR Breast Density Category b: There are scattered areas of fibroglandular density. FINDINGS: The parenchymal pattern of both breasts appears stable. No suspicious mass, distortion, or suspicious microcalcification is identified in either breast to suggest malignancy on the mammogram. Mammographic images were processed with CAD. On physical exam, I do not palpate a discrete mass in the 1 o'clock position of the right breast, but the patient states that she is able to feel a small lump in this region. Targeted ultrasound is performed, showing a circumscribed oval hypoechoic nodule with internal hyperechoic debris or septations. The mass is parallel to the chest wall and measures 0.7 x 0.4 x 0.7 cm in the region of patient concern. No associated color Doppler flow. IMPRESSION: Probably benign 7 mm nodule in the right breast at 1 o'clock position is identified on ultrasound in the region of patient concern. I favor this being a benign complicated cyst. RECOMMENDATION: Right breast ultrasound is suggested in 6 months. The option of ultrasound-guided core needle biopsy for pathologic diagnosis was also discussed with the patient today. She prefers imaging follow-up. I have discussed the findings and recommendations with the patient. Results were also provided in writing at the conclusion of the visit. If applicable, a reminder letter will be sent to the patient regarding the next appointment. BI-RADS CATEGORY  3: Probably benign. Electronically Signed   By: Britta Mccreedy M.D.   On: 09/15/2017 13:59   \  Pelvic/Bimanual Pap is not indicated today per BCCCP guidelines.   Smoking History: Patient is a former smoker that quit in 2000.   Patient Navigation: Patient education provided. Access to services provided for patient  through [facility] program.  Colorectal Cancer Screening: Per patient has a history of a colonoscopy in 2010 or 2011. No complaints today.    Breast and Cervical Cancer Risk Assessment: Patient does not have family history of breast cancer, known genetic mutations, or radiation treatment to the chest before age 61. Patient does not have history of cervical dysplasia, immunocompromised, or DES exposure in-utero.  Risk Assessment     Risk Scores       02/14/2022   Last edited by: Narda Rutherford, LPN   5-year risk: 1.7 %   Lifetime risk: 8.6 %            A: BCCCP exam without pap smear Complaint of bilateral milky breast discharge when expressed.  P: Referred patient to [facility] for a screening mammogram. Appointment scheduled Friday, February 14, 2022 at 1345.  Priscille Heidelberg, RN 02/14/2022 12:55 PM

## 2022-02-14 NOTE — Patient Instructions (Signed)
Explained breast self awareness with [facility]. Patient did not need a Pap smear today due to patient has a history of a hysterectomy for benign reasons. Let patient know that she doesn't need any further Pap smears due to her history of a hysterectomy for benign reasons. Referred patient to [facility] for a screening mammogram. Appointment scheduled Friday, February 14, 2022 at 1345. Patient aware of appointment and will be there. Let patient know [facility] will follow up with her within the next couple weeks with results of her mammogram by letter or phone. [facility] verbalized understanding.  Renuka Farfan, Kathaleen Maser, RN 12:55 PM

## 2022-07-01 IMAGING — US US BREAST*R* LIMITED INC AXILLA
1 series · 6 of 6 positions shown · non-contrast
Comparison: Previous exam(s).

CLINICAL DATA: Patient for 2 year follow-up of probably benign
right breast mass.

EXAM:
DIGITAL DIAGNOSTIC BILATERAL MAMMOGRAM WITH CAD AND TOMO
ULTRASOUND RIGHT BREAST

[Series 1: us breast ltd uni right inc axilla · 6 of 6 slices shown]
[im 1/6]
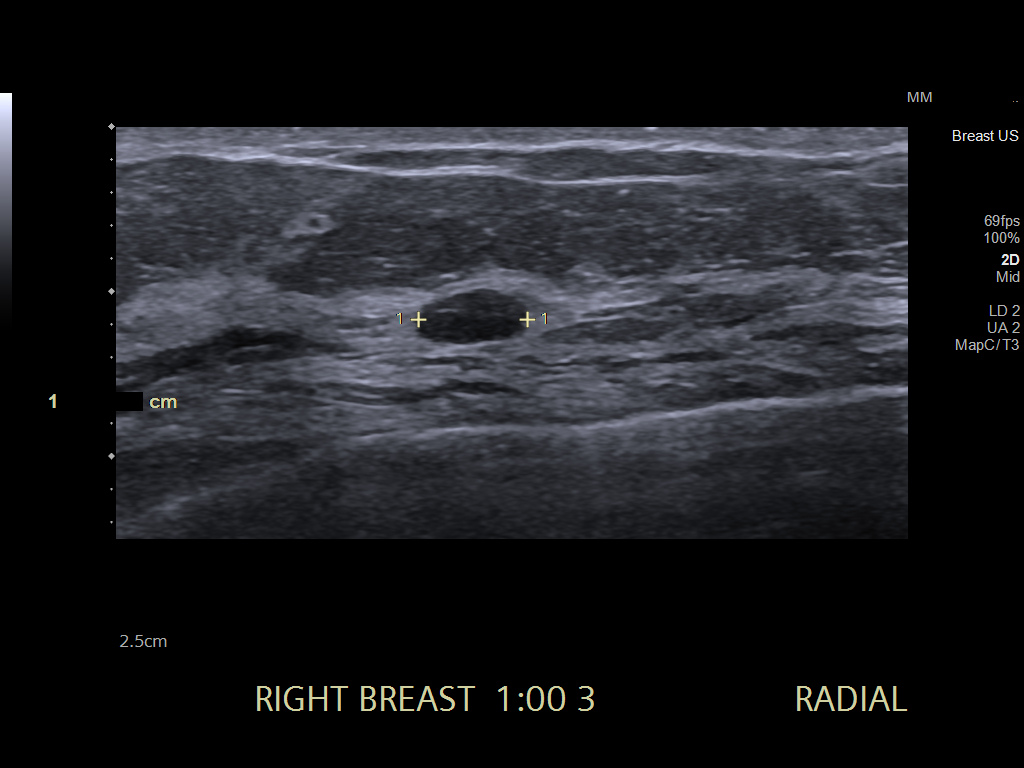
[im 2/6]
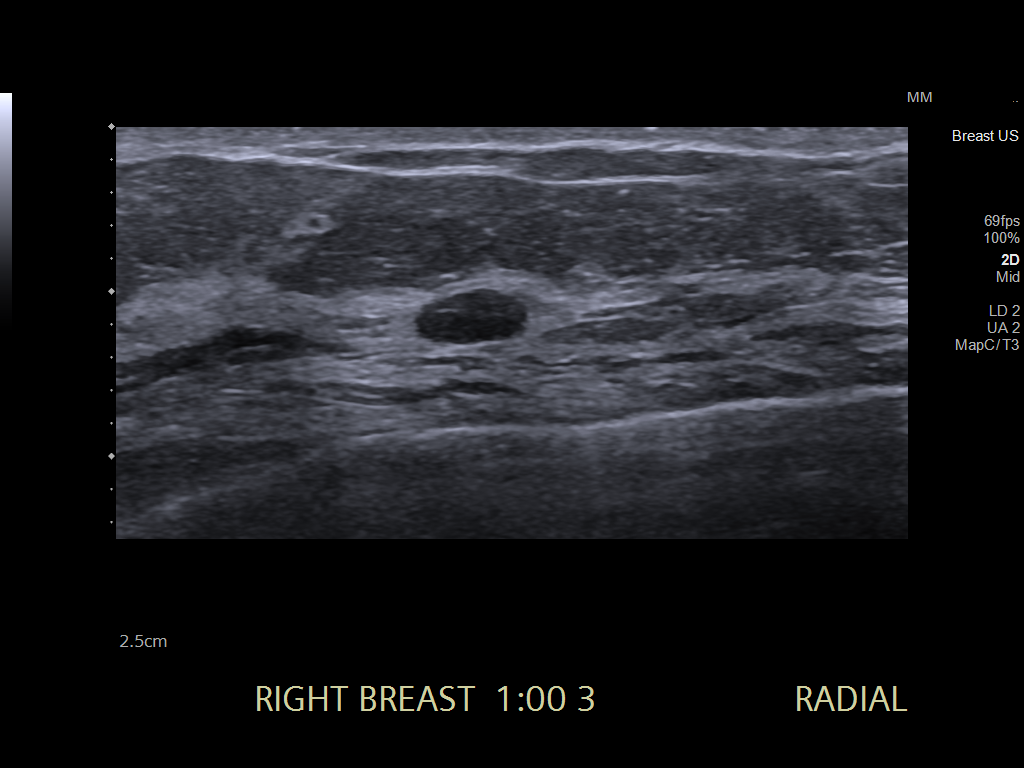
[im 3/6]
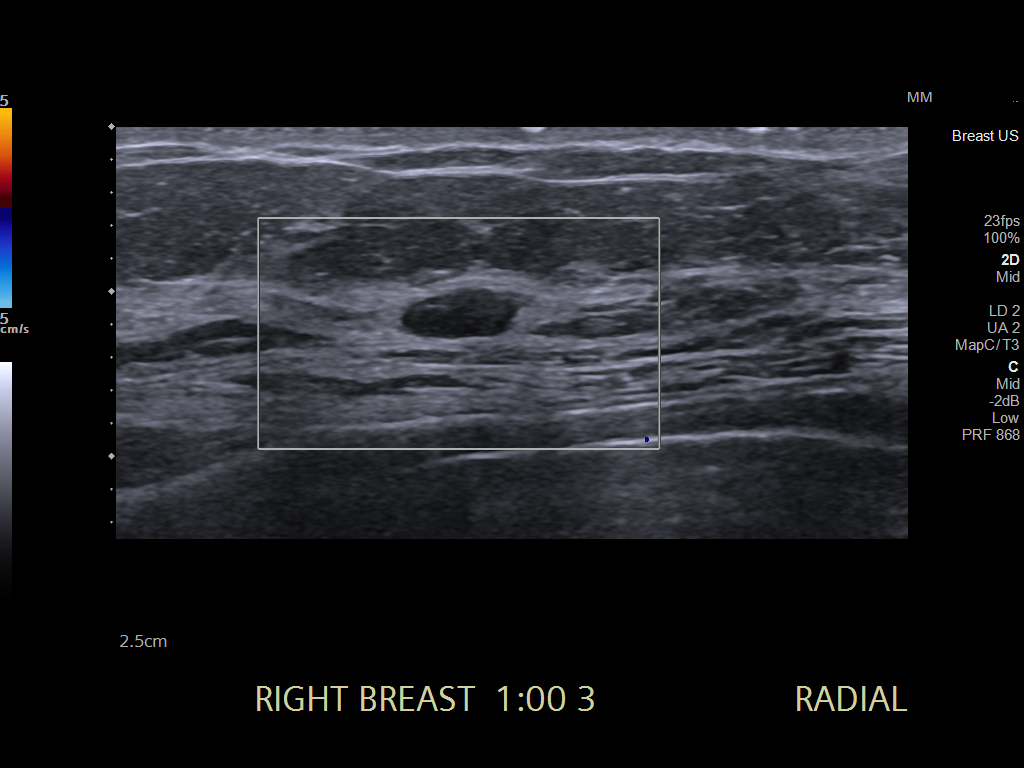
[im 4/6]
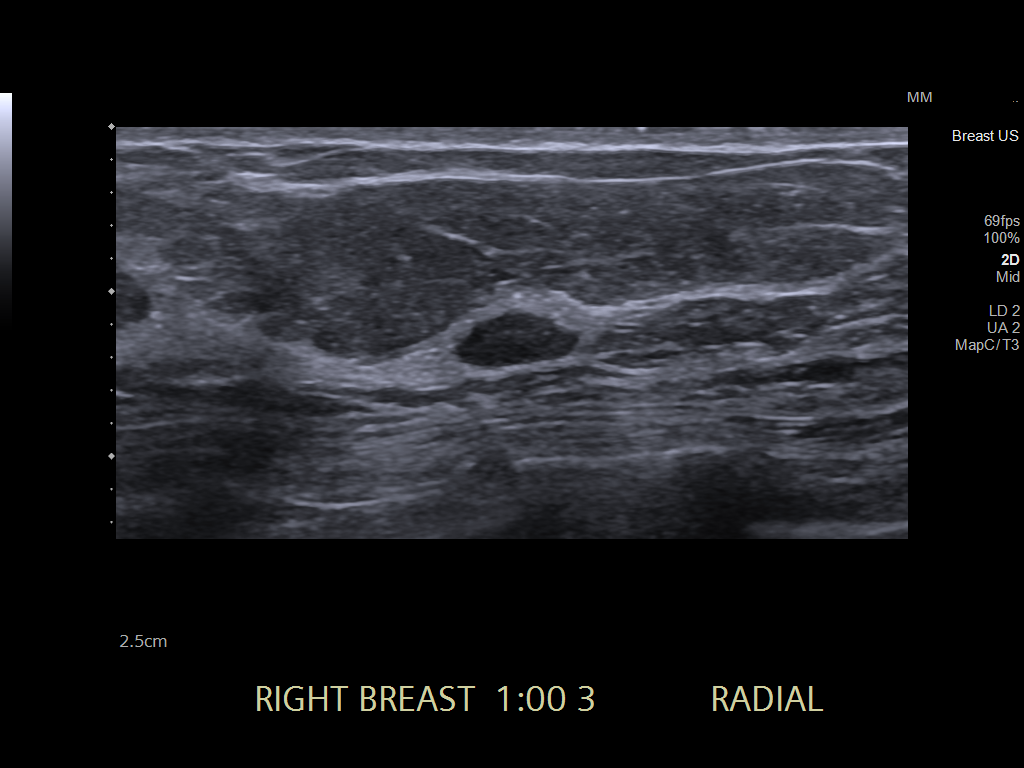
[im 5/6]
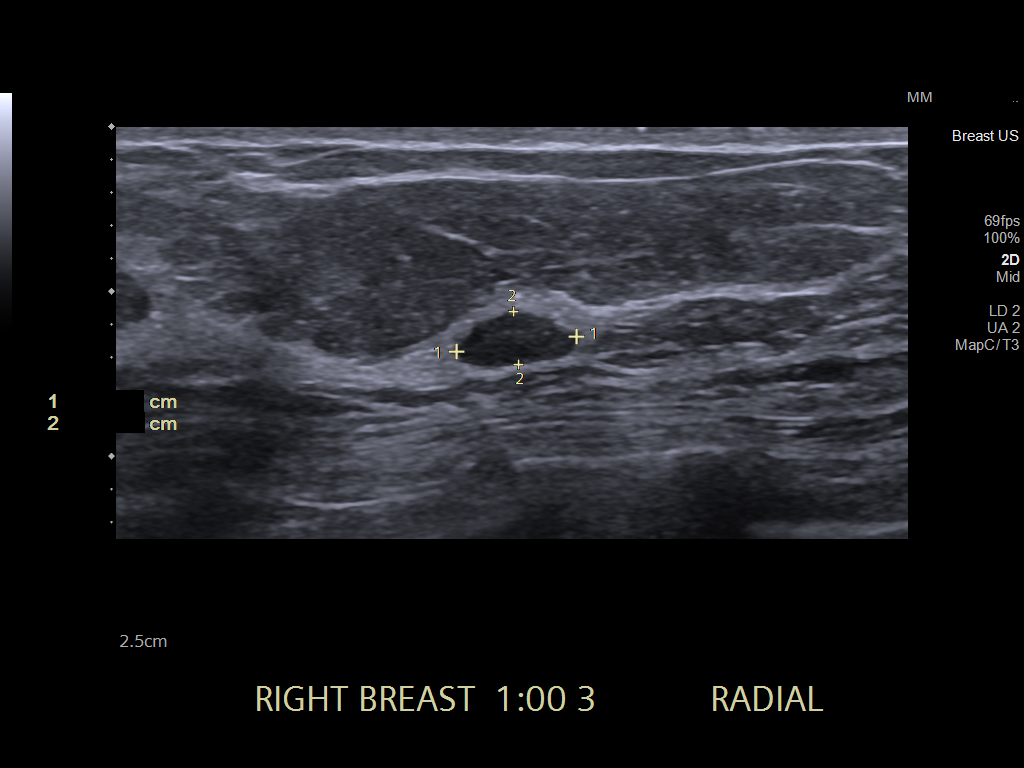
[im 6/6]
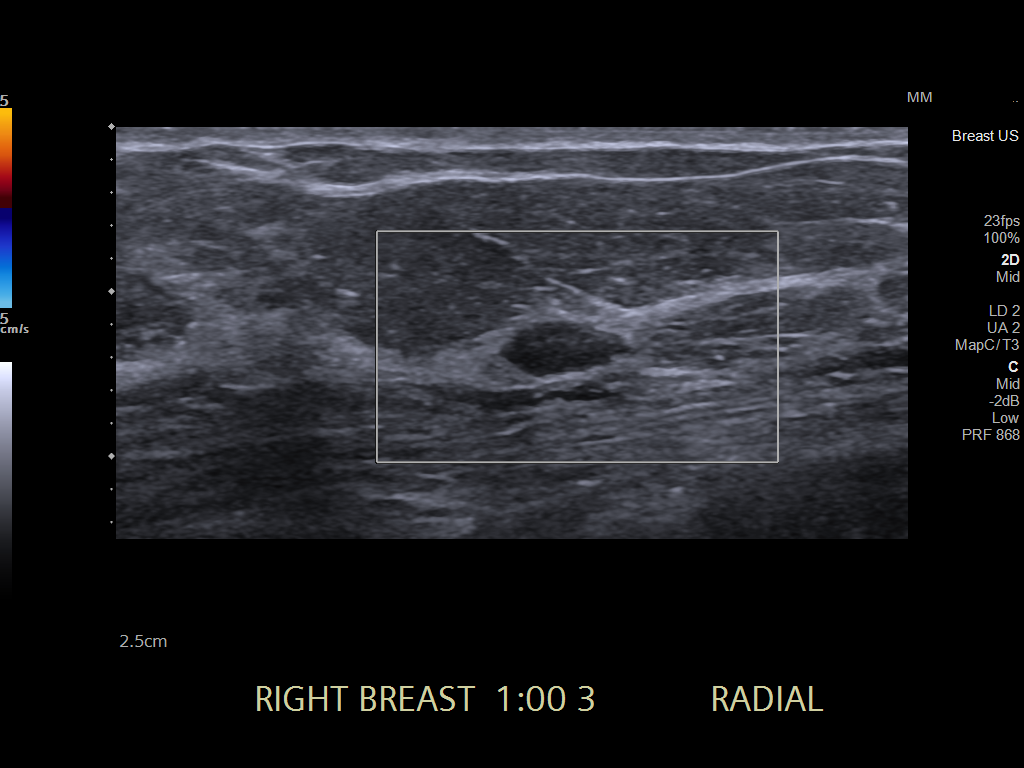

[6 of 6 positions shown; findings below may reference images not displayed]

ACR Breast Density Category c: The breast tissue is heterogeneously
dense, which may obscure small masses.
FINDINGS: Grossly unchanged oval circumscribed mass within the inner right
breast. No new masses, calcifications or nonsurgical distortion
identified within either breast.

Mammographic images were processed with CAD.

Targeted ultrasound is performed, showing a stable 7 x 7 x 3 mm oval
circumscribed hypoechoic right breast position 3 cm compatible with
benign process given stability over time.
IMPRESSION: No mammographic evidence for malignancy.

RECOMMENDATION:
Screening mammogram in one year.(Code:[id number])

I have discussed the findings and recommendations with the patient.
If applicable, a reminder letter will be sent to the patient
regarding the next appointment.

BI-RADS CATEGORY  1: Negative.

## 2022-12-30 ENCOUNTER — Encounter: Payer: Self-pay | Admitting: Family Medicine

## 2022-12-30 ENCOUNTER — Ambulatory Visit: Payer: 59 | Admitting: Family Medicine

## 2022-12-30 VITALS — BP 125/74 | HR 72 | Temp 98.5°F | Ht 62.0 in | Wt 157.0 lb

## 2022-12-30 DIAGNOSIS — E7841 Elevated Lipoprotein(a): Secondary | ICD-10-CM | POA: Diagnosis not present

## 2022-12-30 DIAGNOSIS — Z113 Encounter for screening for infections with a predominantly sexual mode of transmission: Secondary | ICD-10-CM

## 2022-12-30 DIAGNOSIS — R5383 Other fatigue: Secondary | ICD-10-CM

## 2022-12-30 DIAGNOSIS — E785 Hyperlipidemia, unspecified: Secondary | ICD-10-CM | POA: Insufficient documentation

## 2022-12-30 DIAGNOSIS — N952 Postmenopausal atrophic vaginitis: Secondary | ICD-10-CM | POA: Diagnosis not present

## 2022-12-30 DIAGNOSIS — L659 Nonscarring hair loss, unspecified: Secondary | ICD-10-CM

## 2022-12-30 DIAGNOSIS — F339 Major depressive disorder, recurrent, unspecified: Secondary | ICD-10-CM | POA: Diagnosis not present

## 2022-12-30 NOTE — Progress Notes (Signed)
New Patient Office Visit  Subjective   Patient ID: Colleen Singleton, female    DOB: 1961/02/21  Age: 62 y.o. MRN: [id number] HPI Colleen Singleton presents to establish care She reports that she recently obtained insurance and would like to follow up on HRT.  She has been out of medications for several months. She states that she had vaginal mesh that eroded her vaginal lining and she was previously seen by Dr. Bunnie Pion. She completed revision surgery. Continues to have urethral sling in place. Previously had bleeding from that site.  Continues to have symptoms of vaginitis. Reports dryness and slight burning. Denies discharge. Endorses changes to odor in her urine intermittently.   Would like to have her hormones checked because she reports that she is having black facial hair that is increasing. In addition she has noticed increase hair loss. Reports a receeding hair line. States that she was taking a vitamin, but is no longer taking on.   She would also like to have screening for BG/DM. She was taking her BG at home because she was having symptoms of headaches and blurry vision. Fasting AM BG was 193 2 weeks ago. Yesterday was 112 3-4 hours post eating.   Reports that she was cheated on by her partner 2 years ago and still worries about STI "remaining in her system" She is not sexually active at this time.   She is fasting today.   Depression  Reports that she has lot of life stressors. Son and daughter in law have recently visited from [place]. She reports that she has been through a divorce, while losing her mother, and selling her house. She is interested in therapy. She denies SI.  In addition, she report health stressors such as issues with her hands. She reports that she is seeing a Careers adviser that told her she is no longer allowed to work due to damage to her hands. Her symptoms include bilateral numbness, tingling, pain. She reports that she frequently drops things. That her  symptoms limit her from driving, pushing a grocery cart.  Xray completed at chiropractor of her spine.   Outpatient Encounter Medications as of 12/30/2022  Medication Sig   albuterol (VENTOLIN HFA) 108 (90 Base) MCG/ACT inhaler Inhale into the lungs every 6 (six) hours as needed for wheezing or shortness of breath.   estrogens, conjugated, (PREMARIN) 0.3 MG tablet Take 1 tablet (0.3 mg total) by mouth daily. Take daily for 21 days then do not take for 7 days.   Estradiol 0.25 MG/0.25GM GEL Place 1 application onto the skin once for 1 dose.   No facility-administered encounter medications on file as of 12/30/2022.   Past Medical History:  Diagnosis Date   Asthma    GERD (gastroesophageal reflux disease)    Past Surgical History:  Procedure Laterality Date   ABDOMINAL HYSTERECTOMY     BREAST CYST ASPIRATION Right    Family History  Problem Relation Age of Onset   Asthma Mother    COPD Mother    Diabetes Mother    Heart disease Mother    Hypertension Mother    Hypertension Father    Heart disease Father     Social History   Socioeconomic History   Marital status: Single    Spouse name: Not on file   Number of children: 6   Years of education: Not on file   Highest education level: Bachelor's degree (e.g., BA, AB, BS)  Occupational History   Not on file  Tobacco Use   Smoking status: Former    Types: Cigarettes    Quit date: 2000    Years since quitting: 24.4   Smokeless tobacco: Not on file  Vaping Use   Vaping Use: Never used  Substance and Sexual Activity   Alcohol use: Yes    Comment: occasionally   Drug use: Not Currently    Types: Marijuana   Sexual activity: Yes    Birth control/protection: Condom  Other Topics Concern   Not on file  Social History Narrative   Not on file   Social Determinants of Health   Financial Resource Strain: Not on file  Food Insecurity: Food Insecurity Present (02/14/2022)   Hunger Vital Sign    Worried About Running Out of  Food in the Last Year: Sometimes true    Ran Out of Food in the Last Year: Sometimes true  Transportation Needs: No Transportation Needs (02/14/2022)   PRAPARE - Administrator, Civil Service (Medical): No    Lack of Transportation (Non-Medical): No  Physical Activity: Not on file  Stress: Not on file  Social Connections: Not on file  Intimate Partner Violence: Not on file   ROS As per HPI  Objective   BP 125/74   Pulse 72   Temp 98.5 F (36.9 C)   Ht 5\' 2"  (1.575 m)   Wt 157 lb (71.2 kg)   SpO2 95%   BMI 28.72 kg/m   Physical Exam Constitutional:      General: She is not in acute distress.    Appearance: Normal appearance. She is not ill-appearing, toxic-appearing or diaphoretic.  Cardiovascular:     Rate and Rhythm: Normal rate.     Pulses: Normal pulses.     Heart sounds: Normal heart sounds. No murmur heard.    No gallop.  Pulmonary:     Effort: Pulmonary effort is normal. No respiratory distress.     Breath sounds: Normal breath sounds. No stridor. No wheezing, rhonchi or rales.  Musculoskeletal:     Right hand: Decreased range of motion. Decreased strength.     Left hand: Decreased range of motion. Decreased strength.  Skin:    General: Skin is warm.     Capillary Refill: Capillary refill takes less than 2 seconds.  Neurological:     General: No focal deficit present.     Mental Status: She is alert and oriented to person, place, and time. Mental status is at baseline.     GCS: GCS eye subscore is 4. GCS verbal subscore is 5. GCS motor subscore is 6.     Cranial Nerves: Cranial nerves 2-12 are intact.     Motor: Motor function is intact. No weakness.     Coordination: Coordination is intact.  Psychiatric:        Attention and Perception: Attention and perception normal.        Mood and Affect: Mood normal. Affect is tearful.        Speech: Speech normal.        Behavior: Behavior normal. Behavior is cooperative.        Thought Content: Thought  content normal.        Cognition and Memory: Cognition and memory normal.        Judgment: Judgment normal.       12/30/2022   11:10 AM 02/06/2021   11:26 AM  Depression screen PHQ 2/9  Decreased Interest 1 0  Down, Depressed, Hopeless 1 0  PHQ -  2 Score 2 0  Altered sleeping 1   Tired, decreased energy 1   Change in appetite 0   Feeling bad or failure about yourself  0   Trouble concentrating 1   Moving slowly or fidgety/restless 0   Suicidal thoughts 0   PHQ-9 Score 5   Difficult doing work/chores Very difficult       12/30/2022   11:11 AM  GAD 7 : Generalized Anxiety Score  Nervous, Anxious, on Edge 1  Control/stop worrying 1  Worry too much - different things 1  Trouble relaxing 0  Restless 0  Easily annoyed or irritable 0  Afraid - awful might happen 0  Total GAD 7 Score 3  Anxiety Difficulty Somewhat difficult   Assessment & Plan:  1. Hair loss 2. Other fatigue Labs as below. Will communicate results to patient once available.  - TSH - CBC with Differential/Platelet - CMP14+EGFR - VITAMIN D 25 Hydroxy (Vit-D Deficiency, Fractures)  3. Depression, recurrent (HCC) Pt screened positive for depression today. Pt offered nonpharmacologic and pharmacologic therapy. Pt declined initiating pharmacologic treatment at this time. Safety contract established today with patient in clinic. Denies intent to harm herself or others.Is interested in counseling. Patient and/or legal guardian verbally consented to [facility] services about presenting concerns and psychiatric consultation as appropriate.  The services will be billed as appropriate for the patient. - Ambulatory referral to Integrated Behavioral Health  4. Elevated lipoprotein(a) Labs as below. Will communicate results to patient once available.  Fasting - Lipid panel  5. Atrophic vaginitis Labs as below. Will communicate results to patient once available. Symptoms are currently  manageable, she is more concerned with hair growth.  - Hormone Panel  6. Routine screening for STI (sexually transmitted infection) Labs as below. Will communicate results to patient once available.  - HepB+HepC+HIV Panel - RPR  The above assessment and management plan was discussed with the patient. The patient verbalized understanding of and has agreed to the management plan using shared-decision making. Patient is aware to call the clinic if they develop any new symptoms or if symptoms fail to improve or worsen. Patient is aware when to return to the clinic for a follow-up visit. Patient educated on when it is appropriate to go to the emergency department.   Return in about 2 months (around 03/01/2023) for CPE.   Neale Burly, DNP-FNP [facility] [address] [place], [place] [address] [phone]

## 2023-01-09 LAB — SPECIMEN STATUS REPORT

## 2023-01-16 LAB — CBC WITH DIFFERENTIAL/PLATELET
Basophils Absolute: 0 10*3/uL (ref 0.0–0.2)
Basos: 1 %
EOS (ABSOLUTE): 0.1 10*3/uL (ref 0.0–0.4)
Eos: 1 %
Hematocrit: 41.1 % (ref 34.0–46.6)
Hemoglobin: 13.6 g/dL (ref 11.1–15.9)
Immature Grans (Abs): 0 10*3/uL (ref 0.0–0.1)
Immature Granulocytes: 0 %
Lymphocytes Absolute: 3 10*3/uL (ref 0.7–3.1)
Lymphs: 50 %
MCH: 29.8 pg (ref 26.6–33.0)
MCHC: 33.1 g/dL (ref 31.5–35.7)
MCV: 90 fL (ref 79–97)
Monocytes Absolute: 0.4 10*3/uL (ref 0.1–0.9)
Monocytes: 6 %
Neutrophils Absolute: 2.5 10*3/uL (ref 1.4–7.0)
Neutrophils: 42 %
Platelets: 296 10*3/uL (ref 150–450)
RBC: 4.56 x10E6/uL (ref 3.77–5.28)
RDW: 12.8 % (ref 11.7–15.4)
WBC: 6 10*3/uL (ref 3.4–10.8)

## 2023-01-16 LAB — CMP14+EGFR
ALT: 15 IU/L (ref 0–32)
AST: 17 IU/L (ref 0–40)
Albumin/Globulin Ratio: 2 (ref 1.2–2.2)
Albumin: 4.5 g/dL (ref 3.9–4.9)
Alkaline Phosphatase: 98 IU/L (ref 44–121)
BUN/Creatinine Ratio: 13 (ref 12–28)
BUN: 10 mg/dL (ref 8–27)
Bilirubin Total: 0.4 mg/dL (ref 0.0–1.2)
CO2: 23 mmol/L (ref 20–29)
Calcium: 9.7 mg/dL (ref 8.7–10.3)
Chloride: 105 mmol/L (ref 96–106)
Creatinine, Ser: 0.8 mg/dL (ref 0.57–1.00)
Globulin, Total: 2.2 g/dL (ref 1.5–4.5)
Glucose: 105 mg/dL — ABNORMAL HIGH (ref 70–99)
Potassium: 4.2 mmol/L (ref 3.5–5.2)
Sodium: 142 mmol/L (ref 134–144)
Total Protein: 6.7 g/dL (ref 6.0–8.5)
eGFR: 84 mL/min/{1.73_m2} (ref >59)

## 2023-01-16 LAB — LIPID PANEL
Chol/HDL Ratio: 3.7 ratio (ref 0.0–4.4)
Cholesterol, Total: 229 mg/dL — ABNORMAL HIGH (ref 100–199)
HDL: 62 mg/dL (ref >39)
LDL Chol Calc (NIH): 148 mg/dL — ABNORMAL HIGH (ref 0–99)
Triglycerides: 106 mg/dL (ref 0–149)
VLDL Cholesterol Cal: 19 mg/dL (ref 5–40)

## 2023-01-16 LAB — TSH: TSH: 3.04 u[IU]/mL (ref 0.450–4.500)

## 2023-01-16 LAB — HORMONE PANEL (T4,TSH,FSH,TESTT,SHBG,DHEA,ETC)
DHEA-Sulfate, LCMS: 99 ug/dL
Estradiol, Serum, MS: 7.2 pg/mL
Estrone Sulfate: 33 ng/dL
Follicle Stimulating Hormone: 54 m[IU]/mL
Free T-3: 3.5 pg/mL
Free Testosterone, Serum: 2.9 pg/mL
Progesterone, Serum: <10 ng/dL
Sex Hormone Binding Globulin: 33 nmol/L
T4: 6.6 ug/dL
TSH: 3.6 uU/mL
Testosterone, Serum (Total): 19 ng/dL
Testosterone-% Free: 1.5 %
Triiodothyronine (T-3), Serum: 106 ng/dL

## 2023-01-16 LAB — HEPB+HEPC+HIV PANEL
HIV Screen 4th Generation wRfx: NONREACTIVE
Hep B C IgM: NEGATIVE
Hep B Core Total Ab: NEGATIVE
Hep B E Ab: NONREACTIVE
Hep B E Ag: NEGATIVE
Hep B Surface Ab, Qual: NONREACTIVE
Hep C Virus Ab: NONREACTIVE
Hepatitis B Surface Ag: NEGATIVE

## 2023-01-16 LAB — VITAMIN D 25 HYDROXY (VIT D DEFICIENCY, FRACTURES): Vit D, 25-Hydroxy: 39.5 ng/mL (ref 30.0–100.0)

## 2023-01-16 LAB — RPR: RPR Ser Ql: NONREACTIVE

## 2023-01-19 NOTE — Progress Notes (Signed)
Can add A1C to labs when patient comes in July. Cholesterol is elevated. Diet encouraged - increase intake of fresh fruits and vegetables, increase intake of lean proteins. Bake, broil, or grill foods. Avoid fried, greasy, and fatty foods. Avoid fast foods. Increase intake of fiber-rich whole grains. Exercise encouraged - at least 150 minutes per week and advance as tolerated. Can also try red yeast rice and we will recheck in 3 months. Non reactive to Hep B, can schedule appt with triage RN to receive vaccination if patient would like. All other labs normal.

## 2023-02-16 ENCOUNTER — Institutional Professional Consult (permissible substitution): Payer: 59 | Admitting: Professional Counselor

## 2023-03-03 ENCOUNTER — Encounter: Payer: Self-pay | Admitting: Family Medicine

## 2023-03-03 ENCOUNTER — Ambulatory Visit (INDEPENDENT_AMBULATORY_CARE_PROVIDER_SITE_OTHER): Payer: Commercial Managed Care - HMO | Admitting: Family Medicine

## 2023-03-03 ENCOUNTER — Other Ambulatory Visit: Payer: Self-pay | Admitting: Family Medicine

## 2023-03-03 VITALS — BP 118/73 | HR 89 | Temp 98.5°F | Ht 62.0 in | Wt 156.0 lb

## 2023-03-03 DIAGNOSIS — N952 Postmenopausal atrophic vaginitis: Secondary | ICD-10-CM | POA: Diagnosis not present

## 2023-03-03 DIAGNOSIS — R5383 Other fatigue: Secondary | ICD-10-CM

## 2023-03-03 DIAGNOSIS — J4520 Mild intermittent asthma, uncomplicated: Secondary | ICD-10-CM

## 2023-03-03 DIAGNOSIS — L659 Nonscarring hair loss, unspecified: Secondary | ICD-10-CM | POA: Diagnosis not present

## 2023-03-03 DIAGNOSIS — R200 Anesthesia of skin: Secondary | ICD-10-CM

## 2023-03-03 DIAGNOSIS — D492 Neoplasm of unspecified behavior of bone, soft tissue, and skin: Secondary | ICD-10-CM

## 2023-03-03 DIAGNOSIS — R202 Paresthesia of skin: Secondary | ICD-10-CM

## 2023-03-03 DIAGNOSIS — J45909 Unspecified asthma, uncomplicated: Secondary | ICD-10-CM | POA: Insufficient documentation

## 2023-03-03 DIAGNOSIS — M7989 Other specified soft tissue disorders: Secondary | ICD-10-CM

## 2023-03-03 MED ORDER — ALBUTEROL SULFATE HFA 108 (90 BASE) MCG/ACT IN AERS: 2.0000 | INHALATION_SPRAY | Freq: Four times a day (QID) | RESPIRATORY_TRACT | 6 refills | Status: AC | PRN

## 2023-03-03 MED ORDER — ESTRADIOL 0.25 MG/0.25GM TD GEL: 1.0000 "application " | Freq: Once | TRANSDERMAL | 2 refills | Status: AC

## 2023-03-03 NOTE — Progress Notes (Unsigned)
Acute Office Visit  Subjective:  Patient ID: Colleen Singleton, female    DOB: 1961/06/01, 62 y.o.   MRN: [id number]  Chief Complaint  Patient presents with  . hormonal problems   HPI Patient is in today for "hormonal problems". Reports hair loss. States that she is having worsening heair growth on her face and chin. States taht she has no energy to do anything. States that she has followed with gyn since she moved from [place] in 2018. States that she has had ~5 mesh revision surgeries since her first one in 2004.   States that she has a lot of "inflammation" reports that she goes to pick something up and she's not able to due to her hands. States that this started years ago, but comes and goes. Reports that it is worse than before. Reports numbness and tingling. The worse is in thumbs. Reports that right side has numbness and tingling from her shoulder down "wraps around her elbow" will have sharp pain across top of hand and wrist. Reports that other times it "wraps around thumb"  Left side mainly has symptoms in hand, worsened with activities such as washing dishes. States that she was evaluated by Hydrographic surveyor in [place]. States that she is concerned for autoimmune in nature. States that she has history of rashes. States that she has swelling in her hands, hips, knees, and feet. States that she cannot grip sometimes due to the swelling. States that swelling in her thumbs started years ago.   Anxiety/Depression  States that she does not want to take medications because of past history with kidney failure and liver lesions   Skin lesion  States that she noticed it 1 month ago. Denies pain. States that it has grown a lot since then.   ROS As per HPI Objective:  BP 118/73   Pulse 89   Temp 98.5 F (36.9 C)   Ht 5\' 2"  (1.575 m)   Wt 156 lb (70.8 kg)   SpO2 98%   BMI 28.53 kg/m   Physical Exam Constitutional:      General: She is awake. She is not in acute distress.     Appearance: Normal appearance. She is well-developed and well-groomed. She is not ill-appearing, toxic-appearing or diaphoretic.  Cardiovascular:     Rate and Rhythm: Normal rate.     Pulses: Normal pulses.          Radial pulses are 2+ on the right side and 2+ on the left side.       Posterior tibial pulses are 2+ on the right side and 2+ on the left side.     Heart sounds: Normal heart sounds. No murmur heard.    No gallop.  Pulmonary:     Effort: Pulmonary effort is normal. No respiratory distress.     Breath sounds: Normal breath sounds. No stridor. No wheezing, rhonchi or rales.  Musculoskeletal:     Cervical back: Full passive range of motion without pain and neck supple.     Right lower leg: No edema.     Left lower leg: No edema.  Skin:    General: Skin is warm.     Capillary Refill: Capillary refill takes less than 2 seconds.     Comments: ~25mm vesicular lesion on middle finger of right hand.  Neurological:     General: No focal deficit present.     Mental Status: She is alert, oriented to person, place, and time and easily aroused. Mental status is  at baseline.     GCS: GCS eye subscore is 4. GCS verbal subscore is 5. GCS motor subscore is 6.     Motor: No weakness.  Psychiatric:        Attention and Perception: Attention and perception normal.        Mood and Affect: Mood and affect normal.        Speech: Speech normal.        Behavior: Behavior normal. Behavior is cooperative.        Thought Content: Thought content normal. Thought content does not include homicidal or suicidal ideation. Thought content does not include homicidal or suicidal plan.        Cognition and Memory: Cognition and memory normal.        Judgment: Judgment normal.        03/03/2023    2:06 PM 12/30/2022   11:10 AM 02/06/2021   11:26 AM  Depression screen PHQ 2/9  Decreased Interest 1 1 0  Down, Depressed, Hopeless 1 1 0  PHQ - 2 Score 2 2 0  Altered sleeping 3 1   Tired, decreased energy 3  1   Change in appetite 1 0   Feeling bad or failure about yourself  0 0   Trouble concentrating 1 1   Moving slowly or fidgety/restless 0 0   Suicidal thoughts 0 0   PHQ-9 Score 10 5   Difficult doing work/chores Very difficult Very difficult       03/03/2023    2:07 PM 12/30/2022   11:11 AM  GAD 7 : Generalized Anxiety Score  Nervous, Anxious, on Edge 2 1  Control/stop worrying 1 1  Worry too much - different things 1 1  Trouble relaxing 1 0  Restless 0 0  Easily annoyed or irritable 1 0  Afraid - awful might happen 0 0  Total GAD 7 Score 6 3  Anxiety Difficulty Very difficult Somewhat difficult   Assessment & Plan:    The above assessment and management plan was discussed with the patient. The patient verbalized understanding of and has agreed to the management plan using shared-decision making. Patient is aware to call the clinic if they develop any new symptoms or if symptoms fail to improve or worsen. Patient is aware when to return to the clinic for a follow-up visit. Patient educated on when it is appropriate to go to the emergency department.   Return in about 6 weeks (around 04/14/2023) for CPE.  Neale Burly, DNP-FNP [facility] [address] [place], [place] [address] [phone]

## 2023-03-04 LAB — ANA COMPREHENSIVE PANEL
Anti JO-1: <0.2 AI (ref 0.0–0.9)
Centromere Ab Screen: <0.2 AI (ref 0.0–0.9)
Chromatin Ab SerPl-aCnc: <0.2 AI (ref 0.0–0.9)
ENA RNP Ab: 0.3 AI (ref 0.0–0.9)
ENA SM Ab Ser-aCnc: <0.2 AI (ref 0.0–0.9)
ENA SSA (RO) Ab: <0.2 AI (ref 0.0–0.9)
ENA SSB (LA) Ab: <0.2 AI (ref 0.0–0.9)
Scleroderma (Scl-70) (ENA) Antibody, IgG: <0.2 AI (ref 0.0–0.9)
dsDNA Ab: <1 IU/mL (ref 0–9)

## 2023-03-04 LAB — RHEUMASSURE

## 2023-03-04 LAB — C-REACTIVE PROTEIN: CRP: 1 mg/L (ref 0–10)

## 2023-03-04 LAB — SEDIMENTATION RATE: Sed Rate: 6 mm/hr (ref 0–40)

## 2023-03-04 LAB — VITAMIN B12: Vitamin B-12: 354 pg/mL (ref 232–1245)

## 2023-03-04 NOTE — Telephone Encounter (Signed)
Name from pharmacy: ESTRADIOL 0.1% (0.25MG ) GEL PK   Pharmacy comment: Alternative Requested:PLEASE SEND ALTERNATIVE. MEDICATION NOT COVERED UNDER INSURANCE.

## 2023-03-13 NOTE — Progress Notes (Signed)
All labs normal. Continue current workup.

## 2023-04-06 ENCOUNTER — Ambulatory Visit: Payer: Commercial Managed Care - HMO | Admitting: Nurse Practitioner

## 2023-06-10 ENCOUNTER — Encounter: Payer: Commercial Managed Care - HMO | Admitting: Family Medicine

## 2023-07-14 ENCOUNTER — Encounter: Payer: Commercial Managed Care - HMO | Admitting: Family Medicine

## 2023-11-16 DIAGNOSIS — R197 Diarrhea, unspecified: Secondary | ICD-10-CM | POA: Diagnosis not present

## 2023-11-16 DIAGNOSIS — R110 Nausea: Secondary | ICD-10-CM | POA: Diagnosis not present
